# Patient Record
Sex: Female | Born: 1965 | Race: White | Hispanic: No | Smoking: Current every day smoker
Health system: Southern US, Community
[De-identification: ages and names within clinical notes are randomized; demographics above are authoritative.]

## PROBLEM LIST (undated history)

## (undated) DIAGNOSIS — F410 Panic disorder [episodic paroxysmal anxiety] without agoraphobia: Secondary | ICD-10-CM

## (undated) DIAGNOSIS — C801 Malignant (primary) neoplasm, unspecified: Secondary | ICD-10-CM

## (undated) DIAGNOSIS — M1990 Unspecified osteoarthritis, unspecified site: Secondary | ICD-10-CM

## (undated) DIAGNOSIS — F419 Anxiety disorder, unspecified: Secondary | ICD-10-CM

## (undated) DIAGNOSIS — R109 Unspecified abdominal pain: Secondary | ICD-10-CM

## (undated) DIAGNOSIS — M549 Dorsalgia, unspecified: Secondary | ICD-10-CM

## (undated) DIAGNOSIS — K589 Irritable bowel syndrome without diarrhea: Secondary | ICD-10-CM

## (undated) DIAGNOSIS — G8929 Other chronic pain: Secondary | ICD-10-CM

## (undated) DIAGNOSIS — M722 Plantar fascial fibromatosis: Secondary | ICD-10-CM

## (undated) HISTORY — PX: ABDOMINAL HYSTERECTOMY: SHX81

## (undated) HISTORY — PX: APPENDECTOMY: SHX54

## (undated) HISTORY — DX: Anxiety disorder, unspecified: F41.9

## (undated) HISTORY — DX: Malignant (primary) neoplasm, unspecified: C80.1

## (undated) HISTORY — DX: Unspecified osteoarthritis, unspecified site: M19.90

---

## 1996-05-30 DIAGNOSIS — D4959 Neoplasm of unspecified behavior of other genitourinary organ: Secondary | ICD-10-CM | POA: Insufficient documentation

## 2012-12-04 DIAGNOSIS — A630 Anogenital (venereal) warts: Secondary | ICD-10-CM | POA: Insufficient documentation

## 2013-12-13 ENCOUNTER — Ambulatory Visit (INDEPENDENT_AMBULATORY_CARE_PROVIDER_SITE_OTHER): Payer: 59 | Admitting: Family Medicine

## 2013-12-13 VITALS — BP 96/66 | HR 88 | Temp 98.0°F | Resp 16 | Ht 64.75 in | Wt 155.6 lb

## 2013-12-13 DIAGNOSIS — F410 Panic disorder [episodic paroxysmal anxiety] without agoraphobia: Secondary | ICD-10-CM

## 2013-12-13 DIAGNOSIS — M722 Plantar fascial fibromatosis: Secondary | ICD-10-CM

## 2013-12-13 DIAGNOSIS — R4589 Other symptoms and signs involving emotional state: Secondary | ICD-10-CM

## 2013-12-13 DIAGNOSIS — A6000 Herpesviral infection of urogenital system, unspecified: Secondary | ICD-10-CM

## 2013-12-13 DIAGNOSIS — F430 Acute stress reaction: Secondary | ICD-10-CM

## 2013-12-13 MED ORDER — MELOXICAM 15 MG PO TABS
15.0000 mg | ORAL_TABLET | Freq: Every day | ORAL | Status: DC
Start: 2013-12-13 — End: 2014-01-12

## 2013-12-13 MED ORDER — VALACYCLOVIR HCL 500 MG PO TABS: 500.0000 mg | ORAL_TABLET | Freq: Two times a day (BID) | ORAL | Status: DC

## 2013-12-13 MED ORDER — ALPRAZOLAM 0.25 MG PO TABS
0.2500 mg | ORAL_TABLET | Freq: Two times a day (BID) | ORAL | Status: DC | PRN
Start: 2013-12-13 — End: 2014-01-03

## 2013-12-13 NOTE — Patient Instructions (Addendum)
Plantar Fasciitis Plantar fasciitis is a common condition that causes foot pain. It is soreness (inflammation) of the band of tough fibrous tissue on the bottom of the foot that runs from the heel bone (calcaneus) to the ball of the foot. The cause of this soreness may be from excessive standing, poor fitting shoes, running on hard surfaces, being overweight, having an abnormal walk, or overuse (this is common in runners) of the painful foot or feet. It is also common in aerobic exercise dancers and ballet dancers. SYMPTOMS  Most people with plantar fasciitis complain of:  Severe pain in the morning on the bottom of their foot especially when taking the first steps out of bed. This pain recedes after a few minutes of walking.  Severe pain is experienced also during walking following a long period of inactivity.  Pain is worse when walking barefoot or up stairs DIAGNOSIS   Your caregiver will diagnose this condition by examining and feeling your foot.  Special tests such as X-rays of your foot, are usually not needed. PREVENTION   Consult a sports medicine professional before beginning a new exercise program.  Walking programs offer a good workout. With walking there is a lower chance of overuse injuries common to runners. There is less impact and less jarring of the joints.  Begin all new exercise programs slowly. If problems or pain develop, decrease the amount of time or distance until you are at a comfortable level.  Wear good shoes and replace them regularly.  Stretch your foot and the heel cords at the back of the ankle (Achilles tendon) both before and after exercise.  Run or exercise on even surfaces that are not hard. For example, asphalt is better than pavement.  Do not run barefoot on hard surfaces.  If using a treadmill, vary the incline.  Do not continue to workout if you have foot or joint problems. Seek professional help if they do not improve. HOME CARE INSTRUCTIONS     Avoid activities that cause you pain until you recover.  Use ice or cold packs on the problem or painful areas after working out.  Only take over-the-counter or prescription medicines for pain, discomfort, or fever as directed by your caregiver.  Soft shoe inserts or athletic shoes with air or gel sole cushions may be helpful.  If problems continue or become more severe, consult a sports medicine caregiver or your own health care provider. Cortisone is a potent anti-inflammatory medication that may be injected into the painful area. You can discuss this treatment with your caregiver. MAKE SURE YOU:   Understand these instructions.  Will watch your condition.  Will get help right away if you are not doing well or get worse. Document Released: 12/11/2000 Document Revised: 06/10/2011 Document Reviewed: 02/10/2008 ExitCare Patient Information 2015 [employer]. This information is not intended to replace advice given to you by your health care provider. Make sure you discuss any questions you have with your health care provider.   Plantar Fasciitis (Heel Spur Syndrome) with Rehab The plantar fascia is a fibrous, ligament-like, soft-tissue structure that spans the bottom of the foot. Plantar fasciitis is a condition that causes pain in the foot due to inflammation of the tissue. SYMPTOMS   Pain and tenderness on the underneath side of the foot.  Pain that worsens with standing or walking. CAUSES  Plantar fasciitis is caused by irritation and injury to the plantar fascia on the underneath side of the foot. Common mechanisms of injury include:    Direct trauma to bottom of the foot.  Damage to a small nerve that runs under the foot where the main fascia attaches to the heel bone.  Stress placed on the plantar fascia due to bone spurs. RISK INCREASES WITH:   Activities that place stress on the plantar fascia (running, jumping, pivoting, or cutting).  Poor strength and  flexibility.  Improperly fitted shoes.  Tight calf muscles.  Flat feet.  Failure to warm-up properly before activity.  Obesity. PREVENTION  Warm up and stretch properly before activity.  Allow for adequate recovery between workouts.  Maintain physical fitness:  Strength, flexibility, and endurance.  Cardiovascular fitness.  Maintain a health body weight.  Avoid stress on the plantar fascia.  Wear properly fitted shoes, including arch supports for individuals who have flat feet. PROGNOSIS  If treated properly, then the symptoms of plantar fasciitis usually resolve without surgery. However, occasionally surgery is necessary. RELATED COMPLICATIONS   Recurrent symptoms that may result in a chronic condition.  Problems of the lower back that are caused by compensating for the injury, such as limping.  Pain or weakness of the foot during push-off following surgery.  Chronic inflammation, scarring, and partial or complete fascia tear, occurring more often from repeated injections. TREATMENT  Treatment initially involves the use of ice and medication to help reduce pain and inflammation. The use of strengthening and stretching exercises may help reduce pain with activity, especially stretches of the Achilles tendon. These exercises may be performed at home or with a therapist. Your caregiver may recommend that you use heel cups of arch supports to help reduce stress on the plantar fascia. Occasionally, corticosteroid injections are given to reduce inflammation. If symptoms persist for greater than 6 months despite non-surgical (conservative), then surgery may be recommended.  MEDICATION   If pain medication is necessary, then nonsteroidal anti-inflammatory medications, such as aspirin and ibuprofen, or other minor pain relievers, such as acetaminophen, are often recommended.  Do not take pain medication within 7 days before surgery.  Prescription pain relievers may be given if  deemed necessary by your caregiver. Use only as directed and only as much as you need.  Corticosteroid injections may be given by your caregiver. These injections should be reserved for the most serious cases, because they may only be given a certain number of times. HEAT AND COLD  Cold treatment (icing) relieves pain and reduces inflammation. Cold treatment should be applied for 10 to 15 minutes every 2 to 3 hours for inflammation and pain and immediately after any activity that aggravates your symptoms. Use ice packs or massage the area with a piece of ice (ice massage).  Heat treatment may be used prior to performing the stretching and strengthening activities prescribed by your caregiver, physical therapist, or athletic trainer. Use a heat pack or soak the injury in warm water. SEEK IMMEDIATE MEDICAL CARE IF:  Treatment seems to offer no benefit, or the condition worsens.  Any medications produce adverse side effects. EXERCISES RANGE OF MOTION (ROM) AND STRETCHING EXERCISES - Plantar Fasciitis (Heel Spur Syndrome) These exercises may help you when beginning to rehabilitate your injury. Your symptoms may resolve with or without further involvement from your physician, physical therapist or athletic trainer. While completing these exercises, remember:   Restoring tissue flexibility helps normal motion to return to the joints. This allows healthier, less painful movement and activity.  An effective stretch should be held for at least 30 seconds.  A stretch should never be painful. You should   only feel a gentle lengthening or release in the stretched tissue. RANGE OF MOTION - Toe Extension, Flexion  Sit with your right / left leg crossed over your opposite knee.  Grasp your toes and gently pull them back toward the top of your foot. You should feel a stretch on the bottom of your toes and/or foot.  Hold this stretch for __________ seconds.  Now, gently pull your toes toward the bottom  of your foot. You should feel a stretch on the top of your toes and or foot.  Hold this stretch for __________ seconds. Repeat __________ times. Complete this stretch __________ times per day.  RANGE OF MOTION - Ankle Dorsiflexion, Active Assisted  Remove shoes and sit on a chair that is preferably not on a carpeted surface.  Place right / left foot under knee. Extend your opposite leg for support.  Keeping your heel down, slide your right / left foot back toward the chair until you feel a stretch at your ankle or calf. If you do not feel a stretch, slide your bottom forward to the edge of the chair, while still keeping your heel down.  Hold this stretch for __________ seconds. Repeat __________ times. Complete this stretch __________ times per day.  STRETCH - Gastroc, Standing  Place hands on wall.  Extend right / left leg, keeping the front knee somewhat bent.  Slightly point your toes inward on your back foot.  Keeping your right / left heel on the floor and your knee straight, shift your weight toward the wall, not allowing your back to arch.  You should feel a gentle stretch in the right / left calf. Hold this position for __________ seconds. Repeat __________ times. Complete this stretch __________ times per day. STRETCH - Soleus, Standing  Place hands on wall.  Extend right / left leg, keeping the other knee somewhat bent.  Slightly point your toes inward on your back foot.  Keep your right / left heel on the floor, bend your back knee, and slightly shift your weight over the back leg so that you feel a gentle stretch deep in your back calf.  Hold this position for __________ seconds. Repeat __________ times. Complete this stretch __________ times per day. STRETCH - Gastrocsoleus, Standing  Note: This exercise can place a lot of stress on your foot and ankle. Please complete this exercise only if specifically instructed by your caregiver.   Place the ball of your right  / left foot on a step, keeping your other foot firmly on the same step.  Hold on to the wall or a rail for balance.  Slowly lift your other foot, allowing your body weight to press your heel down over the edge of the step.  You should feel a stretch in your right / left calf.  Hold this position for __________ seconds.  Repeat this exercise with a slight bend in your right / left knee. Repeat __________ times. Complete this stretch __________ times per day.  STRENGTHENING EXERCISES - Plantar Fasciitis (Heel Spur Syndrome)  These exercises may help you when beginning to rehabilitate your injury. They may resolve your symptoms with or without further involvement from your physician, physical therapist or athletic trainer. While completing these exercises, remember:   Muscles can gain both the endurance and the strength needed for everyday activities through controlled exercises.  Complete these exercises as instructed by your physician, physical therapist or athletic trainer. Progress the resistance and repetitions only as guided. STRENGTH - Towel   Curls  Sit in a chair positioned on a non-carpeted surface.  Place your foot on a towel, keeping your heel on the floor.  Pull the towel toward your heel by only curling your toes. Keep your heel on the floor.  If instructed by your physician, physical therapist or athletic trainer, add ____________________ at the end of the towel. Repeat __________ times. Complete this exercise __________ times per day. STRENGTH - Ankle Inversion  Secure one end of a rubber exercise band/tubing to a fixed object (table, pole). Loop the other end around your foot just before your toes.  Place your fists between your knees. This will focus your strengthening at your ankle.  Slowly, pull your big toe up and in, making sure the band/tubing is positioned to resist the entire motion.  Hold this position for __________ seconds.  Have your muscles resist the  band/tubing as it slowly pulls your foot back to the starting position. Repeat __________ times. Complete this exercises __________ times per day.  Document Released: 03/18/2005 Document Revised: 06/10/2011 Document Reviewed: 06/30/2008 ExitCare Patient Information 2015 [employer]. This information is not intended to replace advice given to you by your health care provider. Make sure you discuss any questions you have with your health care provider.   
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
## 2014-01-03 ENCOUNTER — Ambulatory Visit (INDEPENDENT_AMBULATORY_CARE_PROVIDER_SITE_OTHER): Payer: 59 | Admitting: Family Medicine

## 2014-01-03 ENCOUNTER — Encounter: Payer: Self-pay | Admitting: Family Medicine

## 2014-01-03 VITALS — BP 110/62 | HR 87 | Temp 98.1°F | Resp 16 | Ht 65.5 in | Wt 155.0 lb

## 2014-01-03 DIAGNOSIS — J4520 Mild intermittent asthma, uncomplicated: Secondary | ICD-10-CM

## 2014-01-03 DIAGNOSIS — Z23 Encounter for immunization: Secondary | ICD-10-CM

## 2014-01-03 DIAGNOSIS — F17200 Nicotine dependence, unspecified, uncomplicated: Secondary | ICD-10-CM | POA: Insufficient documentation

## 2014-01-03 DIAGNOSIS — F430 Acute stress reaction: Secondary | ICD-10-CM

## 2014-01-03 DIAGNOSIS — Z13220 Encounter for screening for lipoid disorders: Secondary | ICD-10-CM

## 2014-01-03 DIAGNOSIS — K589 Irritable bowel syndrome without diarrhea: Secondary | ICD-10-CM

## 2014-01-03 DIAGNOSIS — Z720 Tobacco use: Secondary | ICD-10-CM

## 2014-01-03 DIAGNOSIS — F410 Panic disorder [episodic paroxysmal anxiety] without agoraphobia: Secondary | ICD-10-CM

## 2014-01-03 MED ORDER — ALBUTEROL SULFATE HFA 108 (90 BASE) MCG/ACT IN AERS: 2.0000 | INHALATION_SPRAY | Freq: Four times a day (QID) | RESPIRATORY_TRACT | Status: DC | PRN

## 2014-01-03 MED ORDER — DICYCLOMINE HCL 20 MG PO TABS: 20.0000 mg | ORAL_TABLET | Freq: Three times a day (TID) | ORAL | Status: DC

## 2014-01-03 MED ORDER — ALPRAZOLAM 0.25 MG PO TABS: 0.2500 mg | ORAL_TABLET | Freq: Two times a day (BID) | ORAL | Status: DC | PRN

## 2014-01-03 NOTE — Patient Instructions (Addendum)
Let us know if your GI symptoms do not get better.  You can use bentyl as needed.   I will refer you to GI and will be in touch with your labs asap,

## 2014-01-03 NOTE — Progress Notes (Signed)
[facility] [address], [place] [address] [phone]  Date:  01/03/2014   Name:  Jackie Johnson   DOB:  06/29/1965   MRN:  [id number]  PCP:  Lamar Blinks, MD    Chief Complaint: Plantar Fasciitis   History of Present Illness:  Jackie Johnson is a 48 y.o. very pleasant female patient who presents with the following:  Here today to establish care.  [place]  She has trouble with her feet- she was dx with plantar fascitis on the left.  She has done stretches and used mobic.  She is doing stretches and does feel better.   Her right foot was broken a year ago; she notes some pain under the big toe when she walks some of the time.  This is different from her pain on the left.  She has had trouble with her right foot for 6 months or so.    She also notes that she has a history of "stomach problems," she has had some issues for "years" and has seen GI. Thinks that she likely has IBS.  She will have episodes of pain maybe twice a week-  Last night she noted a lot of bloating and pain.  This was a typical episode for her. She was "doubled over" with pain, then had diarrhea most of the night and some this morning.  She feels better this am.  She had blood drawn this am, went home and ate country ham and had coffee. She has used some bentyl with good results in the past.  She would like to establish with GI in [place],  She did have a colonoscopy in the past- just in the last few years at [facility]   History of ovarian cancer.  She had a total hyst and appendectomy per [facility]  She takes xanax up to BID for anxiety  There are no active problems to display for this patient.   Past Medical History  Diagnosis Date  . Anxiety   . Arthritis   . Cancer     Past Surgical History  Procedure Laterality Date  . Appendectomy    . Abdominal hysterectomy      History  Substance Use Topics  . Smoking status: Current Every Day Smoker  . Smokeless  tobacco: Not on file  . Alcohol Use: No    Family History  Problem Relation Age of Onset  . Cancer Mother   . Heart disease Mother   . Hyperlipidemia Mother   . Hypertension Mother   . Hypertension Father   . Hyperlipidemia Father   . Diabetes Brother   . Hyperlipidemia Brother   . Hypertension Brother   . Heart disease Brother   . Heart disease Maternal Grandfather   . Heart disease Paternal Grandmother   . Hyperlipidemia Paternal Grandmother   . Hypertension Paternal Grandmother   . Hyperlipidemia Brother   . Hypertension Brother   . Hyperlipidemia Brother   . Hypertension Brother     Allergies  Allergen Reactions  . Codeine Itching    Medication list has been reviewed and updated.  Current Outpatient Prescriptions on File Prior to Visit  Medication Sig Dispense Refill  . acyclovir (ZOVIRAX) 200 MG capsule Take 200 mg by mouth 5 (five) times daily.      Marland Kitchen ALPRAZolam (XANAX) 0.25 MG tablet Take 1 tablet (0.25 mg total) by mouth 2 (two) times daily as needed for anxiety.  20 tablet  0  . estradiol (ESTRACE) 2 MG tablet Take 2 mg by mouth daily.      . meloxicam (MOBIC) 15 MG tablet Take 1 tablet (15 mg total) by mouth daily.  30 tablet  0  . pravastatin (PRAVACHOL) 40 MG tablet Take 40 mg by mouth daily.      . valACYclovir (VALTREX) 500 MG tablet Take 1 tablet (500 mg total) by mouth 2 (two) times daily.  10 tablet  3   No current facility-administered medications on file prior to visit.    Review of Systems:  As per HPI- otherwise negative.   Physical Examination: Filed Vitals:   01/03/14 1146  BP: 110/62  Pulse: 87  Temp: 98.1 F (36.7 C)  Resp: 16   Filed Vitals:   01/03/14 1146  Height: 5' 5.5" (1.664 m)  Weight: 155 lb (70.308 kg)   Body mass index is 25.39 kg/(m^2). Ideal Body Weight: Weight in (lb) to have BMI = 25: 152.2  GEN: WDWN, NAD, Non-toxic, A & O x 3, looks well, smoker HEENT: Atraumatic, Normocephalic. Neck supple. No masses, No  LAD. Ears and Nose: No external deformity. CV: RRR, No M/G/R. No JVD. No thrill. No extra heart sounds. PULM: CTA B, no wheezes, crackles, rhonchi. No retractions. No resp. distress. No accessory muscle use. ABD: S, NT, ND, +BS. No rebound. No HSM.  Benign exam EXTR: No c/c/e NEURO Normal gait.  PSYCH: Normally interactive. Conversant. Not depressed or anxious appearing.  Calm demeanor.  Feet: the right foot shows tenderness at the base of the 2nd/ 3rd toes.  Likely a morton's neuroma   Assessment and Plan: IBS (irritable bowel syndrome) - Plan: dicyclomine (BENTYL) 20 MG tablet, CBC, Comprehensive metabolic panel, Ambulatory referral to Gastroenterology  Screening for hyperlipidemia - Plan: Lipid panel  Panic attack as reaction to stress - Plan: ALPRAZolam (XANAX) 0.25 MG tablet  Reactive airway disease, mild intermittent, uncomplicated - Plan: albuterol (PROVENTIL HFA;VENTOLIN HFA) 108 (90 BASE) MCG/ACT inhaler  Tobacco use disorder  Immunization due - Plan: Flu Vaccine QUAD 36+ mos IM  At this time her foot does not bother her enough to do anything further- she will let me know if she does decide she would like a referral Refilled medications as above.  Bentyl as needed for likely IBS, await labs and refer to GI See patient instructions for more details.     Signed Lamar Blinks, MD

## 2014-01-04 ENCOUNTER — Encounter: Payer: Self-pay | Admitting: Family Medicine

## 2014-01-04 LAB — COMPREHENSIVE METABOLIC PANEL
ALBUMIN: 4.3 g/dL (ref 3.5–5.2)
ALK PHOS: 63 U/L (ref 39–117)
ALT: 13 U/L (ref 0–35)
AST: 16 U/L (ref 0–37)
BUN: 11 mg/dL (ref 6–23)
CO2: 25 mEq/L (ref 19–32)
Calcium: 8.9 mg/dL (ref 8.4–10.5)
Chloride: 107 mEq/L (ref 96–112)
Creat: 0.63 mg/dL (ref 0.50–1.10)
Glucose, Bld: 85 mg/dL (ref 70–99)
POTASSIUM: 3.8 meq/L (ref 3.5–5.3)
Sodium: 141 mEq/L (ref 135–145)
Total Bilirubin: 0.6 mg/dL (ref 0.2–1.2)
Total Protein: 6.6 g/dL (ref 6.0–8.3)

## 2014-01-04 LAB — CBC
HEMATOCRIT: 43.6 % (ref 36.0–46.0)
Hemoglobin: 14.9 g/dL (ref 12.0–15.0)
MCH: 31.8 pg (ref 26.0–34.0)
MCHC: 34.2 g/dL (ref 30.0–36.0)
MCV: 93.2 fL (ref 78.0–100.0)
PLATELETS: 189 10*3/uL (ref 150–400)
RBC: 4.68 MIL/uL (ref 3.87–5.11)
RDW: 13.5 % (ref 11.5–15.5)
WBC: 7.2 10*3/uL (ref 4.0–10.5)

## 2014-01-04 LAB — LIPID PANEL
Cholesterol: 194 mg/dL (ref 0–200)
HDL: 39 mg/dL — ABNORMAL LOW (ref >39)
LDL Cholesterol: 134 mg/dL — ABNORMAL HIGH (ref 0–99)
Total CHOL/HDL Ratio: 5 Ratio
Triglycerides: 104 mg/dL (ref <150)
VLDL: 21 mg/dL (ref 0–40)

## 2014-01-06 ENCOUNTER — Encounter: Payer: Self-pay | Admitting: Gastroenterology

## 2014-01-11 ENCOUNTER — Other Ambulatory Visit: Payer: Self-pay | Admitting: Family Medicine

## 2014-01-12 ENCOUNTER — Other Ambulatory Visit: Payer: Self-pay | Admitting: Family Medicine

## 2014-01-21 ENCOUNTER — Telehealth: Payer: Self-pay

## 2014-01-21 NOTE — Telephone Encounter (Signed)
Pharm faxed request for Rx for acyclovir 400 mg instead of valtrex because the copay is a lot less for acyclovir. I have pended for review.

## 2014-02-09 ENCOUNTER — Other Ambulatory Visit: Payer: Self-pay | Admitting: Family Medicine

## 2014-02-10 ENCOUNTER — Telehealth: Payer: Self-pay

## 2014-02-10 DIAGNOSIS — M159 Polyosteoarthritis, unspecified: Secondary | ICD-10-CM

## 2014-02-10 DIAGNOSIS — A609 Anogenital herpesviral infection, unspecified: Secondary | ICD-10-CM

## 2014-02-10 DIAGNOSIS — M150 Primary generalized (osteo)arthritis: Principal | ICD-10-CM

## 2014-02-10 MED ORDER — ACYCLOVIR 200 MG PO CAPS: ORAL_CAPSULE | ORAL | Status: AC

## 2014-02-10 MED ORDER — MELOXICAM 15 MG PO TABS: ORAL_TABLET | ORAL | Status: DC

## 2014-02-10 NOTE — Telephone Encounter (Signed)
Patient of Dr. Lorelei Pont. She is out of her Meloxicam and needs refills. Also, she was switched to Valtrex but it is too expensive and wants to go back to Acyclovir. Pharmacy is correct ([facility] [address] cb# [phone].

## 2014-02-12 ENCOUNTER — Ambulatory Visit (INDEPENDENT_AMBULATORY_CARE_PROVIDER_SITE_OTHER): Payer: 59

## 2014-02-12 ENCOUNTER — Ambulatory Visit (INDEPENDENT_AMBULATORY_CARE_PROVIDER_SITE_OTHER): Payer: 59 | Admitting: Internal Medicine

## 2014-02-12 VITALS — BP 122/74 | HR 99 | Temp 98.6°F | Resp 18 | Ht 65.0 in | Wt 155.0 lb

## 2014-02-12 DIAGNOSIS — R300 Dysuria: Secondary | ICD-10-CM

## 2014-02-12 DIAGNOSIS — M545 Low back pain, unspecified: Secondary | ICD-10-CM

## 2014-02-12 DIAGNOSIS — J45909 Unspecified asthma, uncomplicated: Secondary | ICD-10-CM | POA: Insufficient documentation

## 2014-02-12 DIAGNOSIS — R1011 Right upper quadrant pain: Secondary | ICD-10-CM

## 2014-02-12 DIAGNOSIS — E785 Hyperlipidemia, unspecified: Secondary | ICD-10-CM | POA: Insufficient documentation

## 2014-02-12 DIAGNOSIS — J4520 Mild intermittent asthma, uncomplicated: Secondary | ICD-10-CM

## 2014-02-12 DIAGNOSIS — S300XXA Contusion of lower back and pelvis, initial encounter: Secondary | ICD-10-CM

## 2014-02-12 LAB — COMPLETE METABOLIC PANEL WITH GFR
ALT: 11 U/L (ref 0–35)
AST: 16 U/L (ref 0–37)
Albumin: 4.6 g/dL (ref 3.5–5.2)
Alkaline Phosphatase: 66 U/L (ref 39–117)
BILIRUBIN TOTAL: 0.5 mg/dL (ref 0.2–1.2)
BUN: 10 mg/dL (ref 6–23)
CO2: 27 mEq/L (ref 19–32)
Calcium: 9.5 mg/dL (ref 8.4–10.5)
Chloride: 104 mEq/L (ref 96–112)
Creat: 0.67 mg/dL (ref 0.50–1.10)
GFR, Est African American: >89 mL/min
GLUCOSE: 107 mg/dL — AB (ref 70–99)
Potassium: 4.3 mEq/L (ref 3.5–5.3)
SODIUM: 141 meq/L (ref 135–145)
Total Protein: 7.3 g/dL (ref 6.0–8.3)

## 2014-02-12 LAB — POCT URINALYSIS DIPSTICK
Bilirubin, UA: NEGATIVE
Glucose, UA: NEGATIVE
Ketones, UA: NEGATIVE
Leukocytes, UA: NEGATIVE
NITRITE UA: NEGATIVE
PROTEIN UA: NEGATIVE
Spec Grav, UA: 1.01
Urobilinogen, UA: 0.2
pH, UA: 7

## 2014-02-12 LAB — POCT UA - MICROSCOPIC ONLY
Bacteria, U Microscopic: NEGATIVE
CASTS, UR, LPF, POC: NEGATIVE
CRYSTALS, UR, HPF, POC: NEGATIVE
MUCUS UA: NEGATIVE
RBC, urine, microscopic: NEGATIVE
YEAST UA: NEGATIVE

## 2014-02-12 LAB — POCT CBC
GRANULOCYTE PERCENT: 61.7 % (ref 37–80)
HEMATOCRIT: 47.9 % (ref 37.7–47.9)
Hemoglobin: 15.9 g/dL (ref 12.2–16.2)
LYMPH, POC: 3 (ref 0.6–3.4)
MCH, POC: 31.6 pg — AB (ref 27–31.2)
MCHC: 33.1 g/dL (ref 31.8–35.4)
MCV: 95.5 fL (ref 80–97)
MID (cbc): 0.6 (ref 0–0.9)
MPV: 8.9 fL (ref 0–99.8)
PLATELET COUNT, POC: 185 10*3/uL (ref 142–424)
POC Granulocyte: 5.9 (ref 2–6.9)
POC LYMPH %: 31.7 % (ref 10–50)
POC MID %: 6.6 % (ref 0–12)
RBC: 5.02 M/uL (ref 4.04–5.48)
RDW, POC: 14 %
WBC: 9.6 10*3/uL (ref 4.6–10.2)

## 2014-02-12 MED ORDER — HYDROCODONE-ACETAMINOPHEN 5-325 MG PO TABS: 1.0000 | ORAL_TABLET | Freq: Four times a day (QID) | ORAL | Status: AC | PRN
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
## 2014-02-14 ENCOUNTER — Encounter: Payer: Self-pay | Admitting: *Deleted

## 2014-02-17 ENCOUNTER — Telehealth: Payer: Self-pay

## 2014-02-17 NOTE — Telephone Encounter (Signed)
Pt would like the results to her blood work that was done on 11/14. Please advise pt

## 2014-02-17 NOTE — Telephone Encounter (Signed)
Advised pt

## 2014-03-09 ENCOUNTER — Ambulatory Visit: Payer: Self-pay | Admitting: Gastroenterology

## 2014-03-10 ENCOUNTER — Telehealth: Payer: Self-pay | Admitting: Family Medicine

## 2014-03-10 NOTE — Telephone Encounter (Signed)
Patient states that her plantar fascitis has flared again and the Meloxicam is not strong enough. Can she have something else?  [phone]

## 2014-03-11 NOTE — Telephone Encounter (Signed)
Pt advised to RTC- She states she will be in.

## 2014-03-26 ENCOUNTER — Encounter (HOSPITAL_COMMUNITY): Payer: Self-pay | Admitting: Family Medicine

## 2014-03-26 ENCOUNTER — Emergency Department (HOSPITAL_COMMUNITY)
Admission: EM | Admit: 2014-03-26 | Discharge: 2014-03-26 | Disposition: A | Discharge status: Home / Self Care | Payer: Self-pay | Attending: Emergency Medicine | Admitting: Emergency Medicine

## 2014-03-26 ENCOUNTER — Emergency Department (HOSPITAL_COMMUNITY): Payer: Self-pay

## 2014-03-26 DIAGNOSIS — F410 Panic disorder [episodic paroxysmal anxiety] without agoraphobia: Secondary | ICD-10-CM | POA: Insufficient documentation

## 2014-03-26 DIAGNOSIS — G8929 Other chronic pain: Secondary | ICD-10-CM | POA: Insufficient documentation

## 2014-03-26 DIAGNOSIS — M1990 Unspecified osteoarthritis, unspecified site: Secondary | ICD-10-CM | POA: Insufficient documentation

## 2014-03-26 DIAGNOSIS — F419 Anxiety disorder, unspecified: Secondary | ICD-10-CM | POA: Insufficient documentation

## 2014-03-26 DIAGNOSIS — Z79891 Long term (current) use of opiate analgesic: Secondary | ICD-10-CM | POA: Insufficient documentation

## 2014-03-26 DIAGNOSIS — Z720 Tobacco use: Secondary | ICD-10-CM | POA: Insufficient documentation

## 2014-03-26 DIAGNOSIS — Z79899 Other long term (current) drug therapy: Secondary | ICD-10-CM | POA: Insufficient documentation

## 2014-03-26 DIAGNOSIS — Z8719 Personal history of other diseases of the digestive system: Secondary | ICD-10-CM | POA: Insufficient documentation

## 2014-03-26 DIAGNOSIS — R1030 Lower abdominal pain, unspecified: Secondary | ICD-10-CM | POA: Insufficient documentation

## 2014-03-26 DIAGNOSIS — Z9049 Acquired absence of other specified parts of digestive tract: Secondary | ICD-10-CM | POA: Insufficient documentation

## 2014-03-26 DIAGNOSIS — Z859 Personal history of malignant neoplasm, unspecified: Secondary | ICD-10-CM | POA: Insufficient documentation

## 2014-03-26 DIAGNOSIS — M722 Plantar fascial fibromatosis: Secondary | ICD-10-CM | POA: Insufficient documentation

## 2014-03-26 DIAGNOSIS — Z791 Long term (current) use of non-steroidal anti-inflammatories (NSAID): Secondary | ICD-10-CM | POA: Insufficient documentation

## 2014-03-26 DIAGNOSIS — Z90710 Acquired absence of both cervix and uterus: Secondary | ICD-10-CM | POA: Insufficient documentation

## 2014-03-26 HISTORY — DX: Irritable bowel syndrome, unspecified: K58.9

## 2014-03-26 HISTORY — DX: Panic disorder (episodic paroxysmal anxiety): F41.0

## 2014-03-26 HISTORY — DX: Dorsalgia, unspecified: M54.9

## 2014-03-26 HISTORY — DX: Unspecified abdominal pain: R10.9

## 2014-03-26 HISTORY — DX: Plantar fascial fibromatosis: M72.2

## 2014-03-26 HISTORY — DX: Other chronic pain: G89.29

## 2014-03-26 LAB — URINALYSIS, ROUTINE W REFLEX MICROSCOPIC
Bilirubin Urine: NEGATIVE
GLUCOSE, UA: NEGATIVE mg/dL
Ketones, ur: NEGATIVE mg/dL
LEUKOCYTES UA: NEGATIVE
NITRITE: NEGATIVE
PH: 6.5 (ref 5.0–8.0)
Protein, ur: NEGATIVE mg/dL
SPECIFIC GRAVITY, URINE: 1.008 (ref 1.005–1.030)
Urobilinogen, UA: 0.2 mg/dL (ref 0.0–1.0)

## 2014-03-26 LAB — URINE MICROSCOPIC-ADD ON

## 2014-03-26 MED ORDER — DICYCLOMINE HCL 20 MG PO TABS: 20.0000 mg | ORAL_TABLET | Freq: Two times a day (BID) | ORAL | Status: AC

## 2014-03-26 MED ORDER — DICYCLOMINE HCL 10 MG PO CAPS
10.0000 mg | ORAL_CAPSULE | Freq: Once | ORAL | Status: AC
Start: 2014-03-26 — End: 2014-03-26
  Administered 2014-03-26: 10 mg via ORAL
  Filled 2014-03-26: qty 1

## 2014-03-26 MED ORDER — OXYCODONE-ACETAMINOPHEN 5-325 MG PO TABS: 1.0000 | ORAL_TABLET | ORAL | Status: AC | PRN

## 2014-03-26 MED ORDER — OXYCODONE-ACETAMINOPHEN 5-325 MG PO TABS
1.0000 | ORAL_TABLET | Freq: Once | ORAL | Status: AC
  Administered 2014-03-26: 1 via ORAL
  Filled 2014-03-26: qty 1

## 2014-03-26 NOTE — ED Provider Notes (Signed)
CSN: [id number]     Arrival date & time 03/26/14  5852 History   First MD Initiated Contact with Patient 03/26/14 [phone]     Chief Complaint  Patient presents with  . Abdominal Pain   Jackie Johnson is a 48 y.o. female with a history of chronic low abdominal pain, plantar fasciitis and irritable bowel syndrome who presents emergency department complaining of worsening low abdominal pain as well as worsening left plantar fasciitis. Patient reports she woke up this morning at 4 AM with low abdominal pain that she rates it a 10 out of 10. Patient reports she took Bentyl at 4 AM this morning without relief. Patient reports she feels bloated and has an urge to have a bowel movement. Patient reports having 3 bowel movements today. Patient denies having diarrhea. Patient reports she's had previous studies to rule out Crohn's colitis. The patient reports she's had this pain intermittently since her hysterectomy 17 years ago. She reports her pain is similar today just worse. Patient reports she's had worsening left plantar fasciitis and her meloxicam is not helping. The patient has a previous total hysterectomy as well as appendectomy. The patient walks at work. She denies recent trauma to her foot. The patient denies fevers, chills, dysuria, urinary frequency, urinary urgency, nausea, vomiting, constipation, diarrhea, hematochezia, vaginal bleeding, vaginal discharge, rashes, cough, chest pain or shortness of breath.   (Consider location/radiation/quality/duration/timing/severity/associated sxs/prior Treatment) HPI  Past Medical History  Diagnosis Date  . Anxiety   . Arthritis   . Cancer   . Plantar fasciitis     left  . IBS (irritable bowel syndrome)   . Chronic abdominal pain     chronic ight sided pain, "investigated at [facility] w/o any etiology"  . Panic attack   . Chronic back pain    Past Surgical History  Procedure Laterality Date  . Appendectomy    . Abdominal hysterectomy     Family History   Problem Relation Age of Onset  . Cancer Mother   . Heart disease Mother   . Hyperlipidemia Mother   . Hypertension Mother   . Hypertension Father   . Hyperlipidemia Father   . Diabetes Brother   . Hyperlipidemia Brother   . Hypertension Brother   . Heart disease Brother   . Heart disease Maternal Grandfather   . Heart disease Paternal Grandmother   . Hyperlipidemia Paternal Grandmother   . Hypertension Paternal Grandmother   . Hyperlipidemia Brother   . Hypertension Brother   . Hyperlipidemia Brother   . Hypertension Brother    History  Substance Use Topics  . Smoking status: Current Every Day Smoker -- 1.00 packs/day for 35 years    Types: Cigarettes  . Smokeless tobacco: Not on file  . Alcohol Use: No   OB History    No data available     Review of Systems  Constitutional: Negative for fever, chills and appetite change.  HENT: Negative for congestion, ear pain, sore throat and trouble swallowing.   Eyes: Negative for pain and visual disturbance.  Respiratory: Negative for cough, shortness of breath and wheezing.   Cardiovascular: Negative for chest pain and palpitations.  Gastrointestinal: Positive for abdominal pain. Negative for nausea, vomiting, diarrhea, constipation, blood in stool and abdominal distention.  Genitourinary: Negative for dysuria, frequency, hematuria, flank pain, vaginal bleeding, vaginal discharge and difficulty urinating.  Musculoskeletal: Negative for myalgias, back pain and neck pain.       Left foot pain  Skin: Negative for rash and  wound.  Neurological: Negative for dizziness, weakness, light-headedness, numbness and headaches.  All other systems reviewed and are negative.     Allergies  Codeine  Home Medications   Prior to Admission medications   Medication Sig Start Date End Date Taking? Authorizing Provider  albuterol (PROVENTIL HFA;VENTOLIN HFA) 108 (90 BASE) MCG/ACT inhaler Inhale 2 puffs into the lungs every 6 (six) hours as  needed for wheezing or shortness of breath. 01/03/14  Yes Gay Filler Copland, MD  ALPRAZolam (XANAX) 0.25 MG tablet Take 1 tablet (0.25 mg total) by mouth 2 (two) times daily as needed for anxiety. 01/03/14  Yes Gay Filler Copland, MD  estradiol (ESTRACE) 2 MG tablet Take 2 mg by mouth daily.   Yes Historical Provider, MD  meloxicam (MOBIC) 15 MG tablet TAKE 1 TABLET (15 MG TOTAL) BY MOUTH DAILY. Patient taking differently: Take 15 mg by mouth daily. TAKE 1 TABLET (15 MG TOTAL) BY MOUTH DAILY. 02/10/14  Yes Gay Filler Copland, MD  pravastatin (PRAVACHOL) 40 MG tablet Take 40 mg by mouth daily.   Yes Historical Provider, MD  acyclovir (ZOVIRAX) 200 MG capsule Take 200 mg by mouth 5 (five) times daily.    Historical Provider, MD  acyclovir (ZOVIRAX) 200 MG capsule Take 2 pills three times a day for 5 days; use for recurrence 02/10/14   Darreld Mclean, MD  dicyclomine (BENTYL) 20 MG tablet Take 1 tablet (20 mg total) by mouth 2 (two) times daily. 03/26/14   Verda Cumins Braxtyn Dorff, PA-C  HYDROcodone-acetaminophen (NORCO/VICODIN) 5-325 MG per tablet Take 1 tablet by mouth every 6 (six) hours as needed for moderate pain. 02/12/14   Leandrew Koyanagi, MD  oxyCODONE-acetaminophen (PERCOCET/ROXICET) 5-325 MG per tablet Take 1 tablet by mouth every 4 (four) hours as needed for severe pain. May take 2 tablets PO q 6 hours for severe pain - Do not take with Tylenol as this tablet already contains tylenol 03/26/14   Verda Cumins Jaryah Aracena, PA-C   BP 108/48 mmHg  Pulse 68  Temp(Src) 98.3 F (36.8 C) (Oral)  Resp 18  Ht 5\' 6"  (1.676 m)  Wt 150 lb (68.04 kg)  BMI 24.22 kg/m2  SpO2 98% Physical Exam  Constitutional: She appears well-developed and well-nourished. No distress.  Nontoxic appearing.  HENT:  Head: Normocephalic and atraumatic.  Mouth/Throat: Oropharynx is clear and moist. No oropharyngeal exudate.  Eyes: Conjunctivae are normal. Pupils are equal, round, and reactive to light. Right eye exhibits no  discharge. Left eye exhibits no discharge.  Neck: Neck supple.  Cardiovascular: Normal rate, regular rhythm, normal heart sounds and intact distal pulses.  Exam reveals no gallop and no friction rub.   No murmur heard. Patient's bilateral posterior tibialis pulses are intact.  Pulmonary/Chest: Effort normal and breath sounds normal. No respiratory distress. She has no wheezes. She has no rales.  Abdominal: Soft. Bowel sounds are normal. She exhibits no distension and no mass. There is tenderness. There is no rebound and no guarding.  Patient's abdomen soft. Bowel sounds are present. Mild suprapubic tenderness to palpation. Negative psoas and obturator sign.   Musculoskeletal: Normal range of motion. She exhibits no edema or tenderness.  Left foot is non-tender to palpation. Posterior tibialis pulses are intact bilaterally. No deformity or edema noted.  Lymphadenopathy:    She has no cervical adenopathy.  Neurological: She is alert. Coordination normal.  Skin: Skin is warm and dry. No rash noted. She is not diaphoretic. No erythema. No pallor.  Psychiatric: She has a  normal mood and affect. Her behavior is normal.  Nursing note and vitals reviewed.   ED Course  Procedures (including critical care time) Labs Review Labs Reviewed  URINALYSIS, ROUTINE W REFLEX MICROSCOPIC - Abnormal; Notable for the following:    APPearance CLOUDY (*)    Hgb urine dipstick TRACE (*)    All other components within normal limits  URINE MICROSCOPIC-ADD ON    Imaging Review Dg Abd Acute W/chest  03/26/2014   CLINICAL DATA:  48 year old with lower abdominal pain since 4 a.m. this morning. Ongoing pain since hysterectomy.  EXAM: ACUTE ABDOMEN SERIES (ABDOMEN 2 VIEW & CHEST 1 VIEW)  COMPARISON:  02/12/2014  FINDINGS: Lungs are clear. Normal appearance of the heart and mediastinum. The trachea is midline. No evidence for free air. There is a nonobstructive bowel gas pattern. Calcifications in the pelvis are  suggestive for phleboliths. Punctate densities in the right abdomen could be within bowel. Difficult to exclude small kidney stones.  IMPRESSION: Negative abdominal radiographs.  No acute cardiopulmonary disease.   Electronically Signed   By: Markus Daft M.D.   On: 03/26/2014 11:50     EKG Interpretation None      Filed Vitals:   03/26/14 0920 03/26/14 1208 03/26/14 1228  BP: 112/56 108/48   Pulse: 81 68   Temp: 98.5 F (36.9 C)  98.3 F (36.8 C)  TempSrc: Oral  Oral  Resp: 20 18   Height: 5\' 6"  (1.676 m)    Weight: 150 lb (68.04 kg)    SpO2: 96% 98%      MDM   Meds given in ED:  Medications  dicyclomine (BENTYL) capsule 10 mg (10 mg Oral Given 03/26/14 1038)  oxyCODONE-acetaminophen (PERCOCET/ROXICET) 5-325 MG per tablet 1 tablet (1 tablet Oral Given 03/26/14 1038)    Discharge Medication List as of 03/26/2014 12:18 PM    START taking these medications   Details  oxyCODONE-acetaminophen (PERCOCET/ROXICET) 5-325 MG per tablet Take 1 tablet by mouth every 4 (four) hours as needed for severe pain. May take 2 tablets PO q 6 hours for severe pain - Do not take with Tylenol as this tablet already contains tylenol, Starting 03/26/2014, Until Discontinued, Print        Final diagnoses:  Lower abdominal pain  Plantar fasciitis   Patient has history of chronic low abdominal pain, IBS and plantar fasciitis who presented to the ED complaining of worsening chronic low abdominal pain and worsening left plantar fasciitis. Patient's pain started at 4 AM this morning. Patient is afebrile nontoxic appearing. Patient is soft with mild suprapubic tenderness to palpation. Patient denies urinary symptoms. Patient had a previous hysterectomy and appendectomy.  The patient's urinalysis remarkable only for trace hemoglobin. The patient's acute abdomen series was unremarkable. Patient's pain is worsening of her chronic abdominal pain. See no need for further workup at this time. The patient  reports that her abdominal pain is much improved with Bentyl and Percocet. Patient reports that her foot pain has completely resolved. I provided the patient a prescription for Bentyl due to her being almost out of this medication. I provided the patient prescription for Percocet that she can take as needed for breakthrough pain. I advised patient use caution when taking Percocet as it can make her drowsy. Advised patient not to drive while taking Percocet. Advised the patient she needs to follow-up with her primary care provider for continued abdominal pain. I advised patient to return to emergency department with new or worsening symptoms or new  concerns. The patient verbalized understanding and agreement with plan.   This patient was discussed with Dr. Thurnell Garbe who agrees with assessment and plan.      Hanley Hays, PA-C 03/26/14 [place], DO 03/28/14 [phone]

## 2014-03-26 NOTE — ED Notes (Signed)
Pt having lower abd pain and feels like her "guts are being twisted". Pt hx of hysterectomy. sts she has been seen for this before. sts also she is having left foot pain. LBM yesterday.

## 2014-03-26 NOTE — Discharge Instructions (Signed)
Abdominal Pain Many things can cause abdominal pain. Usually, abdominal pain is not caused by a disease and will improve without treatment. It can often be observed and treated at home. Your health care provider will do a physical exam and possibly order blood tests and X-rays to help determine the seriousness of your pain. However, in many cases, more time must pass before a clear cause of the pain can be found. Before that point, your health care provider may not know if you need more testing or further treatment. HOME CARE INSTRUCTIONS  Monitor your abdominal pain for any changes. The following actions may help to alleviate any discomfort you are experiencing:  Only take over-the-counter or prescription medicines as directed by your health care provider.  Do not take laxatives unless directed to do so by your health care provider.  Try a clear liquid diet (broth, tea, or water) as directed by your health care provider. Slowly move to a bland diet as tolerated. SEEK MEDICAL CARE IF:  You have unexplained abdominal pain.  You have abdominal pain associated with nausea or diarrhea.  You have pain when you urinate or have a bowel movement.  You experience abdominal pain that wakes you in the night.  You have abdominal pain that is worsened or improved by eating food.  You have abdominal pain that is worsened with eating fatty foods.  You have a fever. SEEK IMMEDIATE MEDICAL CARE IF:   Your pain does not go away within 2 hours.  You keep throwing up (vomiting).  Your pain is felt only in portions of the abdomen, such as the right side or the left lower portion of the abdomen.  You pass bloody or black tarry stools. MAKE SURE YOU:  Understand these instructions.   Will watch your condition.   Will get help right away if you are not doing well or get worse.  Document Released: 12/26/2004 Document Revised: 03/23/2013 Document Reviewed: 11/25/2012 [facility] Patient Information  2015 [place], [place]. This information is not intended to replace advice given to you by your health care provider. Make sure you discuss any questions you have with your health care provider. Plantar Fasciitis Plantar fasciitis is a common condition that causes foot pain. It is soreness (inflammation) of the band of tough fibrous tissue on the bottom of the foot that runs from the heel bone (calcaneus) to the ball of the foot. The cause of this soreness may be from excessive standing, poor fitting shoes, running on hard surfaces, being overweight, having an abnormal walk, or overuse (this is common in runners) of the painful foot or feet. It is also common in aerobic exercise dancers and ballet dancers. SYMPTOMS  Most people with plantar fasciitis complain of:  Severe pain in the morning on the bottom of their foot especially when taking the first steps out of bed. This pain recedes after a few minutes of walking.  Severe pain is experienced also during walking following a long period of inactivity.  Pain is worse when walking barefoot or up stairs DIAGNOSIS   Your caregiver will diagnose this condition by examining and feeling your foot.  Special tests such as X-rays of your foot, are usually not needed. PREVENTION   Consult a sports medicine professional before beginning a new exercise program.  Walking programs offer a good workout. With walking there is a lower chance of overuse injuries common to runners. There is less impact and less jarring of the joints.  Begin all new exercise programs  slowly. If problems or pain develop, decrease the amount of time or distance until you are at a comfortable level.  Wear good shoes and replace them regularly.  Stretch your foot and the heel cords at the back of the ankle (Achilles tendon) both before and after exercise.  Run or exercise on even surfaces that are not hard. For example, asphalt is better than pavement.  Do not run barefoot on  hard surfaces.  If using a treadmill, vary the incline.  Do not continue to workout if you have foot or joint problems. Seek professional help if they do not improve. HOME CARE INSTRUCTIONS   Avoid activities that cause you pain until you recover.  Use ice or cold packs on the problem or painful areas after working out.  Only take over-the-counter or prescription medicines for pain, discomfort, or fever as directed by your caregiver.  Soft shoe inserts or athletic shoes with air or gel sole cushions may be helpful.  If problems continue or become more severe, consult a sports medicine caregiver or your own health care provider. Cortisone is a potent anti-inflammatory medication that may be injected into the painful area. You can discuss this treatment with your caregiver. MAKE SURE YOU:   Understand these instructions.  Will watch your condition.  Will get help right away if you are not doing well or get worse. Document Released: 12/11/2000 Document Revised: 06/10/2011 Document Reviewed: 02/10/2008 [facility] Patient Information 2015 [place], [place]. This information is not intended to replace advice given to you by your health care provider. Make sure you discuss any questions you have with your health care provider.

## 2014-04-26 ENCOUNTER — Other Ambulatory Visit: Payer: Self-pay

## 2014-04-26 DIAGNOSIS — F430 Acute stress reaction: Principal | ICD-10-CM

## 2014-04-26 DIAGNOSIS — F410 Panic disorder [episodic paroxysmal anxiety] without agoraphobia: Secondary | ICD-10-CM

## 2014-04-26 MED ORDER — ALPRAZOLAM 0.25 MG PO TABS: 0.2500 mg | ORAL_TABLET | Freq: Two times a day (BID) | ORAL | Status: AC | PRN

## 2014-04-26 NOTE — Telephone Encounter (Signed)
Pt would like a refill on xanax. [facility] on [address] [phone]

## 2014-04-26 NOTE — Telephone Encounter (Signed)
Rx pending, pharmacy changed

## 2014-04-26 NOTE — Telephone Encounter (Signed)
Rx called in 

## 2014-06-10 NOTE — Telephone Encounter (Signed)
Pt was Rxd acyclovir

## 2015-01-27 ENCOUNTER — Other Ambulatory Visit: Payer: Self-pay | Admitting: Family Medicine

## 2015-02-25 ENCOUNTER — Other Ambulatory Visit: Payer: Self-pay | Admitting: Family Medicine

## 2015-04-21 ENCOUNTER — Encounter: Payer: Self-pay | Admitting: Family Medicine

## 2015-04-26 ENCOUNTER — Encounter: Payer: Self-pay | Admitting: Family Medicine

## 2015-09-28 ENCOUNTER — Other Ambulatory Visit: Payer: Self-pay | Admitting: Physician Assistant

## 2015-09-29 ENCOUNTER — Other Ambulatory Visit: Payer: Self-pay | Admitting: Emergency Medicine

## 2015-09-29 DIAGNOSIS — J4520 Mild intermittent asthma, uncomplicated: Secondary | ICD-10-CM

## 2015-09-29 MED ORDER — ALBUTEROL SULFATE HFA 108 (90 BASE) MCG/ACT IN AERS: 2.0000 | INHALATION_SPRAY | Freq: Four times a day (QID) | RESPIRATORY_TRACT | Status: AC | PRN

## 2015-09-29 NOTE — Telephone Encounter (Signed)
Received refill request from [facility] for Ventolin HFA inhaler. Pt has not been seen by Dr. Lorelei Pont since 01/03/2014.  Called pt to make sure she still uses this inhaler. Informed pt that since she has not been seen in a few years I could only send a refill for 1 inhaler and for additional refills an appointment will be necessary. Pt currently lives in [place] and looking for a new PCP. Pt  verbalized understanding that she will need a PCP for additional refills.

## 2016-10-11 IMAGING — CR DG ABDOMEN ACUTE W/ 1V CHEST
3 series · 3 of 3 positions shown · non-contrast
Comparison: 02/12/2014

CLINICAL DATA: 48-year-old with lower abdominal pain since 4 a.m.
this morning. Ongoing pain since hysterectomy.

EXAM:
ACUTE ABDOMEN SERIES (ABDOMEN 2 VIEW & CHEST 1 VIEW)

[chest pa]
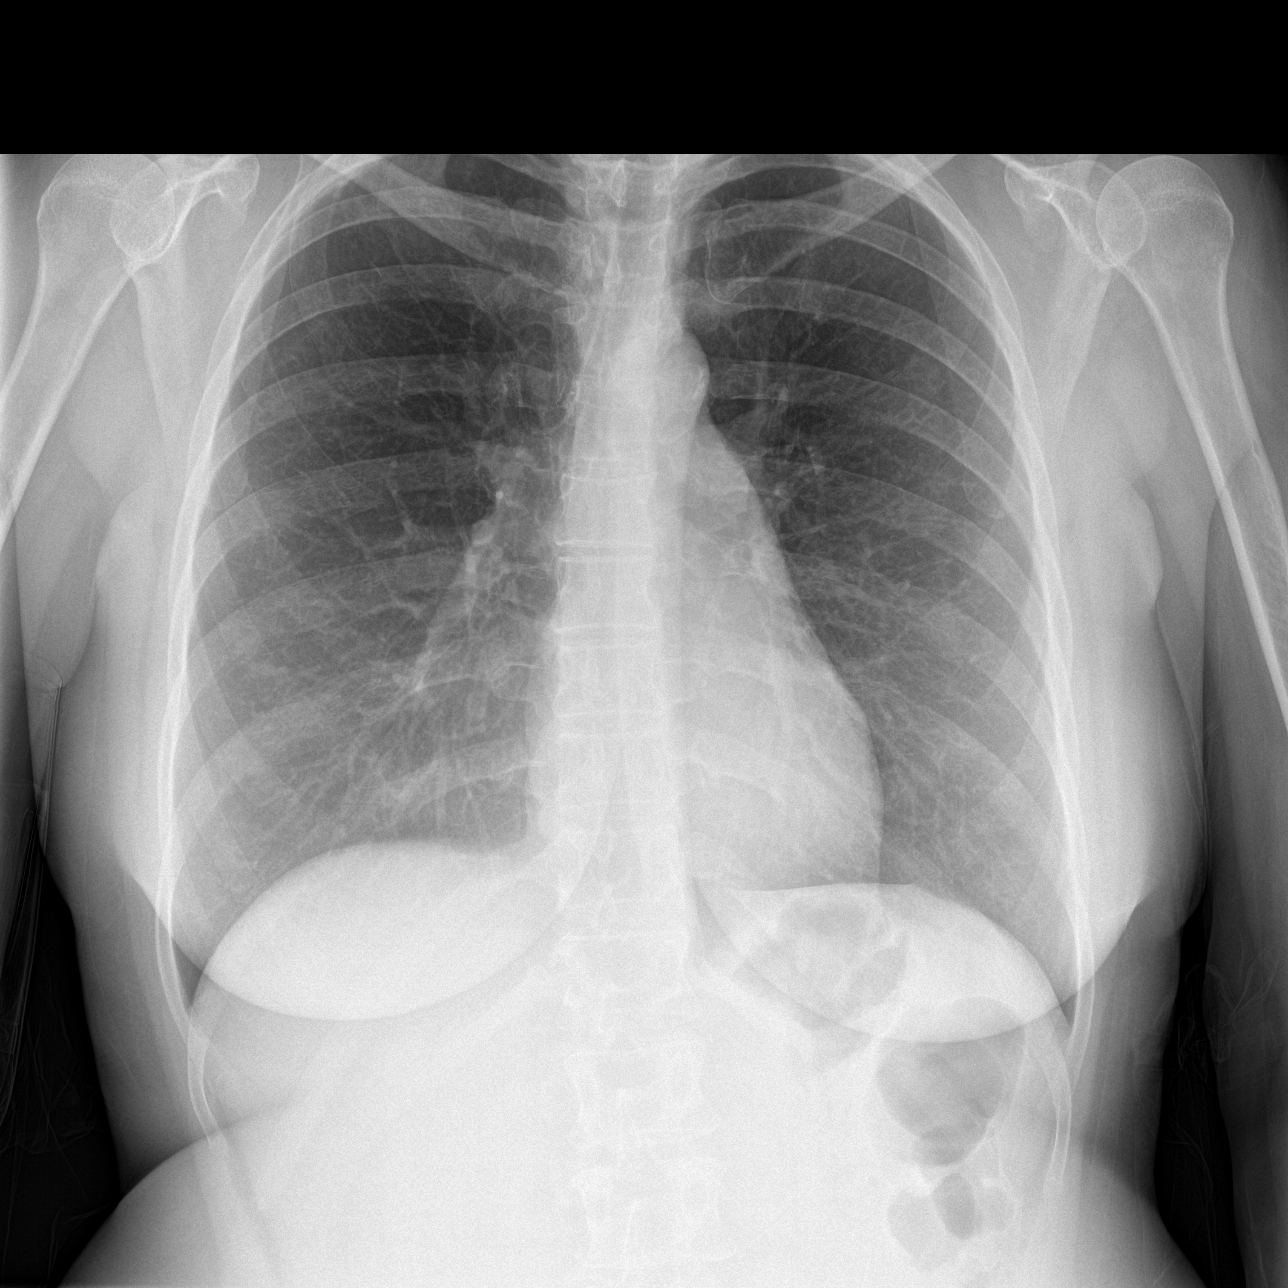

[abdomen erect]
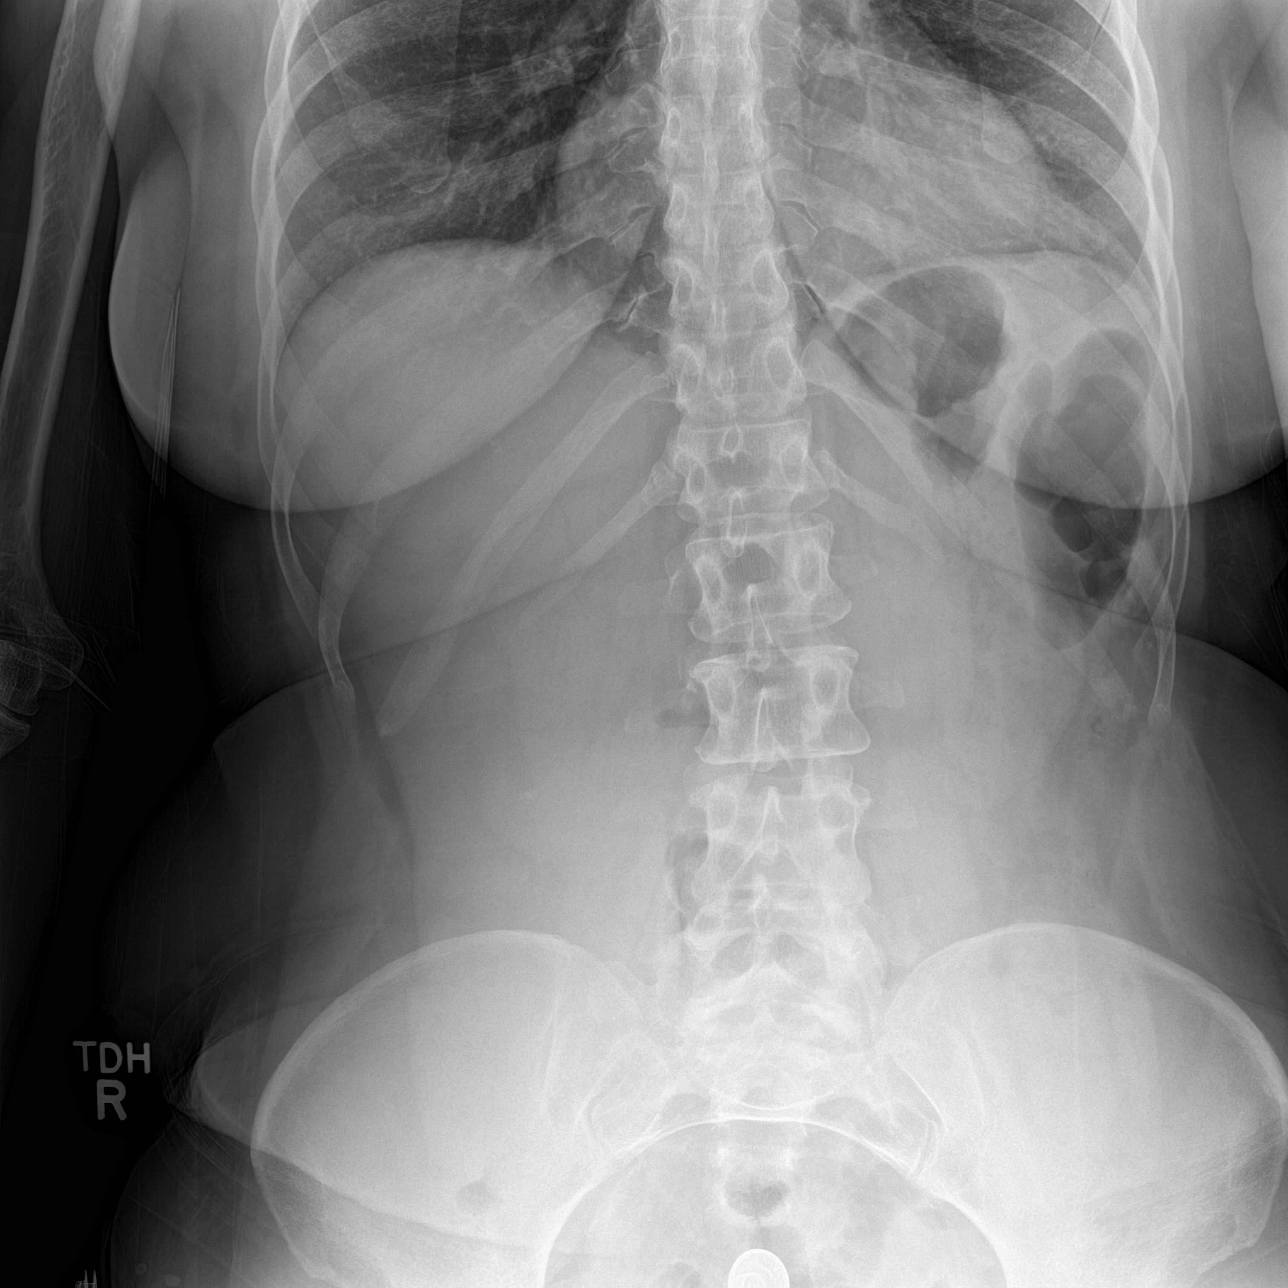

[abdomen supine]
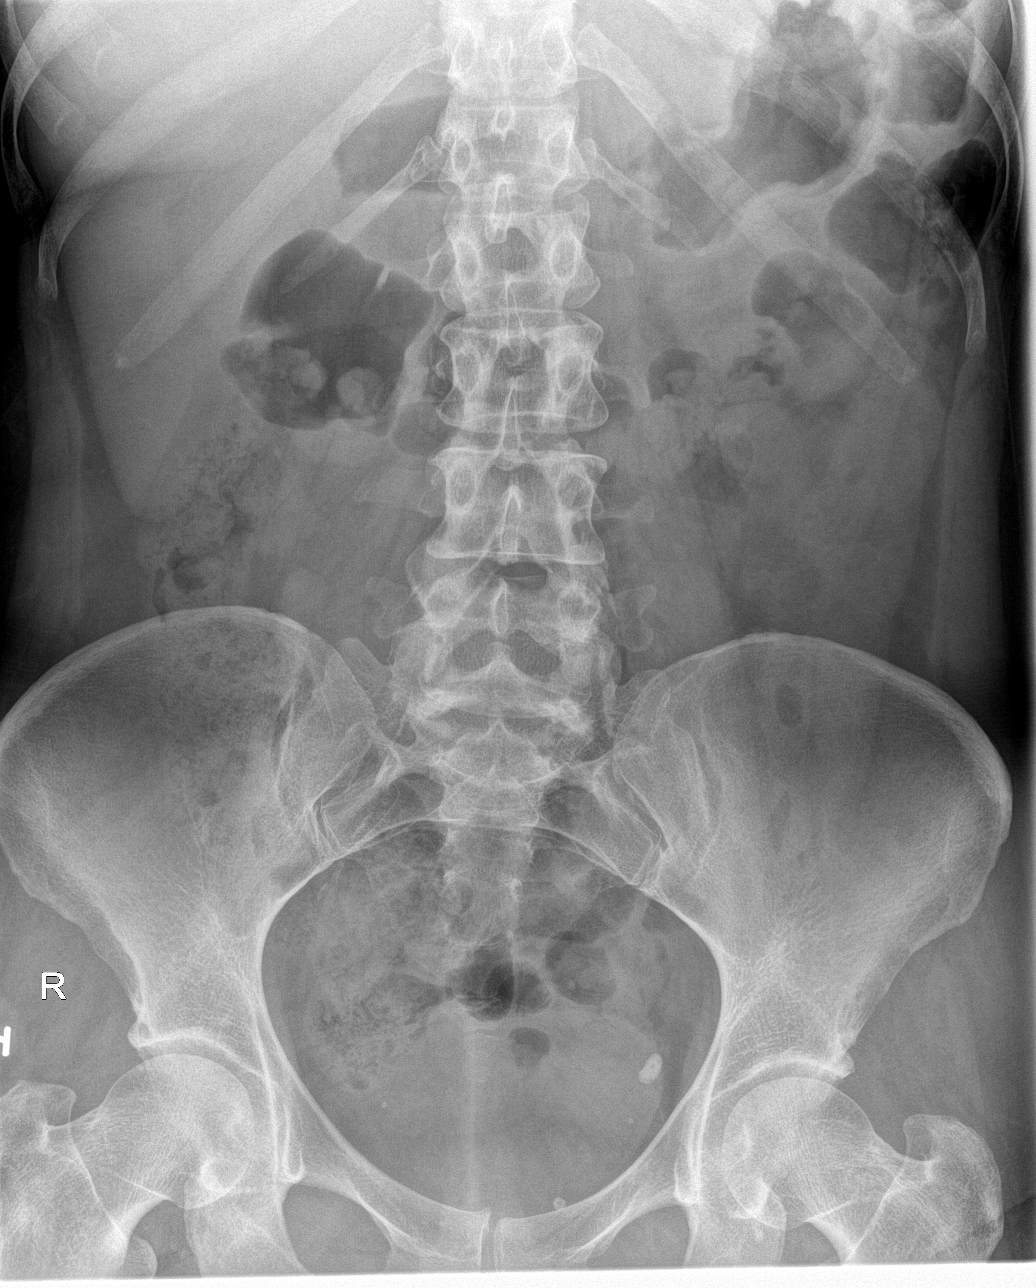

[3 of 3 positions shown; findings below may reference images not displayed]

FINDINGS: Lungs are clear. Normal appearance of the heart and mediastinum. The
trachea is midline. No evidence for free air. There is a
nonobstructive bowel gas pattern. Calcifications in the pelvis are
suggestive for phleboliths. Punctate densities in the right abdomen
could be within bowel. Difficult to exclude small kidney stones.
IMPRESSION: Negative abdominal radiographs.  No acute cardiopulmonary disease.

## 2021-07-30 DEATH — deceased
